# Patient Record
Sex: Female | Born: 1960 | Race: White | Hispanic: No | Marital: Married | Smoking: Former smoker
Health system: Southern US, Community
[De-identification: ages and names within clinical notes are randomized; demographics above are authoritative.]

## PROBLEM LIST (undated history)

## (undated) DIAGNOSIS — N879 Dysplasia of cervix uteri, unspecified: Secondary | ICD-10-CM

## (undated) DIAGNOSIS — F329 Major depressive disorder, single episode, unspecified: Secondary | ICD-10-CM

## (undated) DIAGNOSIS — F32A Depression, unspecified: Secondary | ICD-10-CM

## (undated) DIAGNOSIS — D649 Anemia, unspecified: Secondary | ICD-10-CM

## (undated) HISTORY — PX: HERNIA REPAIR: SHX51

## (undated) HISTORY — PX: APPENDECTOMY: SHX54

---

## 2004-04-30 ENCOUNTER — Ambulatory Visit: Payer: Self-pay | Admitting: Obstetrics and Gynecology

## 2005-06-15 ENCOUNTER — Ambulatory Visit: Payer: Self-pay | Admitting: Obstetrics and Gynecology

## 2006-06-19 ENCOUNTER — Ambulatory Visit: Payer: Self-pay | Admitting: Obstetrics and Gynecology

## 2007-07-03 ENCOUNTER — Ambulatory Visit: Payer: Self-pay | Admitting: Obstetrics and Gynecology

## 2008-10-08 ENCOUNTER — Ambulatory Visit: Payer: Self-pay | Admitting: Obstetrics and Gynecology

## 2009-10-26 ENCOUNTER — Ambulatory Visit: Payer: Self-pay | Admitting: Obstetrics and Gynecology

## 2009-11-09 ENCOUNTER — Ambulatory Visit: Payer: Self-pay | Admitting: Surgery

## 2009-12-03 ENCOUNTER — Ambulatory Visit: Payer: Self-pay | Admitting: Surgery

## 2010-11-08 ENCOUNTER — Ambulatory Visit: Payer: Self-pay | Admitting: Obstetrics and Gynecology

## 2011-11-22 ENCOUNTER — Ambulatory Visit: Payer: Self-pay | Admitting: Obstetrics and Gynecology

## 2012-11-22 ENCOUNTER — Ambulatory Visit: Payer: Self-pay | Admitting: Obstetrics and Gynecology

## 2013-11-26 ENCOUNTER — Ambulatory Visit: Payer: Self-pay | Admitting: Obstetrics and Gynecology

## 2014-12-16 ENCOUNTER — Other Ambulatory Visit: Payer: Self-pay | Admitting: Obstetrics and Gynecology

## 2014-12-16 DIAGNOSIS — Z1231 Encounter for screening mammogram for malignant neoplasm of breast: Secondary | ICD-10-CM

## 2014-12-23 ENCOUNTER — Ambulatory Visit
Admission: RE | Admit: 2014-12-23 | Discharge: 2014-12-23 | Disposition: A | Discharge status: Home / Self Care | Payer: BC Managed Care – PPO | Source: Ambulatory Visit | Attending: Obstetrics and Gynecology | Admitting: Obstetrics and Gynecology

## 2014-12-23 DIAGNOSIS — Z1231 Encounter for screening mammogram for malignant neoplasm of breast: Secondary | ICD-10-CM

## 2015-03-29 ENCOUNTER — Encounter: Payer: Self-pay | Admitting: *Deleted

## 2015-03-29 ENCOUNTER — Ambulatory Visit
Admission: EM | Admit: 2015-03-29 | Discharge: 2015-03-29 | Disposition: A | Discharge status: Home / Self Care | Payer: BC Managed Care – PPO | Attending: Internal Medicine | Admitting: Internal Medicine

## 2015-03-29 DIAGNOSIS — K047 Periapical abscess without sinus: Secondary | ICD-10-CM | POA: Diagnosis not present

## 2015-03-29 DIAGNOSIS — Z23 Encounter for immunization: Secondary | ICD-10-CM | POA: Diagnosis not present

## 2015-03-29 MED ORDER — TETANUS-DIPHTH-ACELL PERTUSSIS 5-2.5-18.5 LF-MCG/0.5 IM SUSP: 0.5000 mL | Freq: Once | INTRAMUSCULAR | Status: DC

## 2015-03-29 MED ORDER — INFLUENZA VIRUS VACC SPLIT PF IM SUSP: 0.5000 mL | Freq: Once | INTRAMUSCULAR | Status: DC

## 2015-03-29 MED ORDER — CLINDAMYCIN HCL 300 MG PO CAPS: 300.0000 mg | ORAL_CAPSULE | Freq: Three times a day (TID) | ORAL | Status: DC

## 2015-03-29 MED ORDER — INFLUENZA VAC SPLIT QUAD 0.5 ML IM SUSY
0.5000 mL | PREFILLED_SYRINGE | INTRAMUSCULAR | Status: AC
  Administered 2015-03-29: 0.5 mL via INTRAMUSCULAR

## 2015-03-29 MED ORDER — TETANUS-DIPHTHERIA TOXOIDS TD 5-2 LFU IM INJ
0.5000 mL | INJECTION | Freq: Once | INTRAMUSCULAR | Status: AC
  Administered 2015-03-29: 0.5 mL via INTRAMUSCULAR

## 2015-03-29 NOTE — Discharge Instructions (Signed)
Prescription for clindamycin (antibiotic) was sent to the [facility] in [place]. Followup with the dentist, to see if dental abscess has formed. Recheck if rash/blisters occur in swollen area of L cheek.

## 2015-03-29 NOTE — ED Provider Notes (Signed)
CSN: [id number]     Arrival date & time 03/29/15  1506 History   First MD Initiated Contact with Patient 03/29/15 1633     Chief Complaint  Patient presents with  . Facial Pain   HPI  53yo lady with remote hx root canal L upper incisor.  Now for 2-3d with pain, swelling L cheek/upper gums.  Some sensitivity of L upper medial incisor with biting. No fever. No rash.  No runny/congested nose.   Pt requests update of tetanus vaccine and a flu shot while she is here.  History reviewed. No pertinent past medical history. Past Surgical History  Procedure Laterality Date  . Appendectomy    . Hernia repair      Social History  Substance Use Topics  . Smoking status: Former Games developer  . Smokeless tobacco: None  . Alcohol Use: No    Review of Systems  All other systems reviewed and are negative.   Allergies  Amoxicillin and Augmentin  Home Medications   Prior to Admission medications   Medication Sig Start Date End Date Taking? Authorizing Provider  sertraline (ZOLOFT) 100 MG tablet Take 200 mg by mouth daily.   Yes Historical Provider, MD  clindamycin (CLEOCIN) 300 MG capsule Take 1 capsule (300 mg total) by mouth 3 (three) times daily. 03/29/15   Eustace Moore, MD   Meds Ordered and Administered this Visit   Medications  tetanus & diphtheria toxoids (adult) [facility]) injection 0.5 mL (0.5 mLs Intramuscular Given 03/29/15 1728)  Influenza vac split quadrivalent PF (FLUARIX) injection 0.5 mL (0.5 mLs Intramuscular Given 03/29/15 1710)    BP 97/51 mmHg  Pulse 65  Temp(Src) 98.1 F (36.7 C) (Oral)  Ht  (1.549 m)  Wt 108 lb (48.988 kg)  BMI 20.42 kg/m2  SpO2 99% No data found.   Physical Exam  Constitutional: She is oriented to person, place, and time. No distress.  Alert, nicely groomed  HENT:  Head: Atraumatic.  Minimal bogginess of gingiva surrounding upper incisors, no drainage observed.  L cheek slightly swollen/full, compared to R.  No redness, no rash observed.   No trismus.  Eyes:  Conjugate gaze, no eye redness/drainage  Neck: Neck supple.  Cardiovascular: Normal rate.   Pulmonary/Chest: No respiratory distress.  Abdominal: She exhibits no distension.  Musculoskeletal: Normal range of motion.  No leg swelling  Neurological: She is alert and oriented to person, place, and time.  Skin: Skin is warm and dry.  Pink. No cyanosis No rash on face.  Nursing note and vitals reviewed.   ED Course  Procedures (including critical care time)  Medications  tetanus & diphtheria toxoids (adult) (TENIVAC) injection 0.5 mL (0.5 mLs Intramuscular Given 03/29/15 1728)  Influenza vac split quadrivalent PF (FLUARIX) injection 0.5 mL (0.5 mLs Intramuscular Given 03/29/15 1710)     MDM   1. Dental infection    Discharge Medication List as of 03/29/2015  4:55 PM    START taking these medications   Details  clindamycin (CLEOCIN) 300 MG capsule Take 1 capsule (300 mg total) by mouth 3 (three) times daily., Starting 03/29/2015, Until Discontinued, Normal       Recheck if rash develops L cheek/nose.  Followup dentist to assess for dental abscess.    Eustace Moore, MD 03/29/15 [phone]

## 2015-03-29 NOTE — ED Notes (Signed)
Pt states she started having tooth pain Thursday, then woke up this morning with left sided facial pain and swelling

## 2016-03-14 ENCOUNTER — Other Ambulatory Visit: Payer: Self-pay | Admitting: Family Medicine

## 2016-03-14 DIAGNOSIS — Z1231 Encounter for screening mammogram for malignant neoplasm of breast: Secondary | ICD-10-CM

## 2016-04-04 ENCOUNTER — Ambulatory Visit
Admission: RE | Admit: 2016-04-04 | Discharge: 2016-04-04 | Disposition: A | Discharge status: Home / Self Care | Payer: BC Managed Care – PPO | Source: Ambulatory Visit | Attending: Family Medicine | Admitting: Family Medicine

## 2016-04-04 DIAGNOSIS — Z1231 Encounter for screening mammogram for malignant neoplasm of breast: Secondary | ICD-10-CM | POA: Insufficient documentation

## 2017-03-10 ENCOUNTER — Other Ambulatory Visit: Payer: Self-pay | Admitting: Family Medicine

## 2017-03-10 DIAGNOSIS — R102 Pelvic and perineal pain: Secondary | ICD-10-CM

## 2017-03-10 DIAGNOSIS — N939 Abnormal uterine and vaginal bleeding, unspecified: Secondary | ICD-10-CM

## 2017-03-14 ENCOUNTER — Ambulatory Visit
Admission: RE | Admit: 2017-03-14 | Discharge: 2017-03-14 | Disposition: A | Discharge status: Home / Self Care | Payer: BC Managed Care – PPO | Source: Ambulatory Visit | Attending: Family Medicine | Admitting: Family Medicine

## 2017-03-14 DIAGNOSIS — N939 Abnormal uterine and vaginal bleeding, unspecified: Secondary | ICD-10-CM | POA: Diagnosis not present

## 2017-03-14 DIAGNOSIS — R102 Pelvic and perineal pain: Secondary | ICD-10-CM | POA: Insufficient documentation

## 2017-03-16 ENCOUNTER — Other Ambulatory Visit: Payer: Self-pay | Admitting: Family Medicine

## 2017-03-16 DIAGNOSIS — Z1231 Encounter for screening mammogram for malignant neoplasm of breast: Secondary | ICD-10-CM

## 2017-04-07 ENCOUNTER — Ambulatory Visit
Admission: RE | Admit: 2017-04-07 | Discharge: 2017-04-07 | Disposition: A | Discharge status: Home / Self Care | Payer: BC Managed Care – PPO | Source: Ambulatory Visit | Attending: Family Medicine | Admitting: Family Medicine

## 2017-04-07 DIAGNOSIS — Z1231 Encounter for screening mammogram for malignant neoplasm of breast: Secondary | ICD-10-CM | POA: Diagnosis present

## 2017-04-21 ENCOUNTER — Encounter
Admission: RE | Admit: 2017-04-21 | Discharge: 2017-04-21 | Disposition: A | Discharge status: Home / Self Care | Payer: BC Managed Care – PPO | Source: Ambulatory Visit | Attending: Surgery | Admitting: Surgery

## 2017-04-21 HISTORY — DX: Dysplasia of cervix uteri, unspecified: N87.9

## 2017-04-21 HISTORY — DX: Anemia, unspecified: D64.9

## 2017-04-21 HISTORY — DX: Major depressive disorder, single episode, unspecified: F32.9

## 2017-04-21 HISTORY — DX: Depression, unspecified: F32.A

## 2017-04-21 NOTE — Patient Instructions (Signed)
  Your procedure is scheduled on: 04-28-17 Report to Same Day Surgery 2nd floor medical mall [facility](Medical Mall Entrance-take elevator on left to 2nd floor.  Check in with surgery information desk.) To find out your arrival time please call [phone] between 1PM - 3PM on 04-27-17  Remember: Instructions that are not followed completely may result in serious medical risk, up to and including death, or upon the discretion of your surgeon and anesthesiologist your surgery may need to be rescheduled.    _x___ 1. Do not eat food after midnight the night before your procedure. You may drink clear liquids up to 2 hours before you are scheduled to arrive at the hospital for your procedure.  Do not drink clear liquids within 2 hours of your scheduled arrival to the hospital.  Clear liquids include  --Water or Apple juice without pulp  --Clear carbohydrate beverage such as ClearFast or Gatorade  --Black Coffee or Clear Tea (No milk, no creamers, do not add anything to the coffee or Tea Type 1 and type 2 diabetics should only drink water.  No gum chewing or hard candies.     __x__ 2. No Alcohol for 24 hours before or after surgery.   __x__3. No Smoking for 24 prior to surgery.   ____  4. Bring all medications with you on the day of surgery if instructed.    __x__ 5. Notify your doctor if there is any change in your medical condition     (cold, fever, infections).     Do not wear jewelry, make-up, hairpins, clips or nail polish.  Do not wear lotions, powders, or perfumes. You may wear deodorant.  Do not shave 48 hours prior to surgery. Men may shave face and neck.  Do not bring valuables to the hospital.    [facility] is not responsible for any belongings or valuables.               Contacts, dentures or bridgework may not be worn into surgery.  Leave your suitcase in the car. After surgery it may be brought to your room.  For patients admitted to the hospital, discharge time is determined by  your treatment team.   Patients discharged the day of surgery will not be allowed to drive home.  You will need someone to drive you home and stay with you the night of your procedure.    Please read over the following fact sheets that you were given:     _x___ TAKE THE FOLLOWING MEDICATION THE MORNING OF SURGERY WITH A SMALL SIP OF WATER. These include:  1. SERTRALINE (ZOLOFT)  2.  3.  4.  5.  6.  ____Fleets enema or Magnesium Citrate as directed.   ____ Use CHG Soap or sage wipes as directed on instruction sheet   ____ Use inhalers on the day of surgery and bring to hospital day of surgery  ____ Stop Metformin and Janumet 2 days prior to surgery.    ____ Take 1/2 of usual insulin dose the night before surgery and none on the morning surgery.   ____ Follow recommendations from Cardiologist, Pulmonologist or PCP regarding stopping Aspirin, Coumadin, Plavix ,Eliquis, Effient, or Pradaxa, and Pletal.  X____Stop Anti-inflammatories such as Advil, Aleve, Ibuprofen, Motrin, Naproxen, Naprosyn, Goodies powders or aspirin products NOW-OK to take Tylenol    ____ Stop supplements until after surgery.   ____ Bring C-Pap to the hospital.

## 2017-04-28 ENCOUNTER — Encounter
Admission: RE | Disposition: A | Discharge status: Home / Self Care | Payer: Self-pay | Source: Ambulatory Visit | Attending: Surgery

## 2017-04-28 ENCOUNTER — Encounter: Payer: Self-pay | Admitting: Emergency Medicine

## 2017-04-28 ENCOUNTER — Ambulatory Visit: Payer: BC Managed Care – PPO | Admitting: Anesthesiology

## 2017-04-28 ENCOUNTER — Ambulatory Visit
Admission: RE | Admit: 2017-04-28 | Discharge: 2017-04-28 | Disposition: A | Discharge status: Home / Self Care | Payer: BC Managed Care – PPO | Source: Ambulatory Visit | Attending: Surgery | Admitting: Surgery

## 2017-04-28 DIAGNOSIS — Z8249 Family history of ischemic heart disease and other diseases of the circulatory system: Secondary | ICD-10-CM | POA: Diagnosis not present

## 2017-04-28 DIAGNOSIS — K429 Umbilical hernia without obstruction or gangrene: Secondary | ICD-10-CM | POA: Insufficient documentation

## 2017-04-28 DIAGNOSIS — Z87891 Personal history of nicotine dependence: Secondary | ICD-10-CM | POA: Diagnosis not present

## 2017-04-28 DIAGNOSIS — F329 Major depressive disorder, single episode, unspecified: Secondary | ICD-10-CM | POA: Diagnosis not present

## 2017-04-28 DIAGNOSIS — K439 Ventral hernia without obstruction or gangrene: Secondary | ICD-10-CM | POA: Insufficient documentation

## 2017-04-28 DIAGNOSIS — Z880 Allergy status to penicillin: Secondary | ICD-10-CM | POA: Diagnosis not present

## 2017-04-28 DIAGNOSIS — Z79899 Other long term (current) drug therapy: Secondary | ICD-10-CM | POA: Diagnosis not present

## 2017-04-28 HISTORY — PX: UMBILICAL HERNIA REPAIR: SHX196

## 2017-04-28 HISTORY — PX: VENTRAL HERNIA REPAIR: SHX424

## 2017-04-28 SURGERY — REPAIR, HERNIA, VENTRAL
Anesthesia: General | Site: Abdomen | Wound class: Clean

## 2017-04-28 MED ORDER — HYDROCODONE-ACETAMINOPHEN 5-325 MG PO TABS
1.0000 | ORAL_TABLET | Freq: Four times a day (QID) | ORAL | 0 refills | Status: AC | PRN
Start: 2017-04-28 — End: ?

## 2017-04-28 MED ORDER — LACTATED RINGERS IV SOLN
INTRAVENOUS | Status: DC
  Administered 2017-04-28 (×2): via INTRAVENOUS

## 2017-04-28 MED ORDER — MIDAZOLAM HCL 2 MG/2ML IJ SOLN
INTRAMUSCULAR | Status: DC | PRN
  Administered 2017-04-28: 1 mg via INTRAVENOUS

## 2017-04-28 MED ORDER — EPHEDRINE SULFATE 50 MG/ML IJ SOLN
INTRAMUSCULAR | Status: DC | PRN
  Administered 2017-04-28 (×2): 10 mg via INTRAVENOUS

## 2017-04-28 MED ORDER — BUPIVACAINE-EPINEPHRINE 0.5% -1:200000 IJ SOLN
INTRAMUSCULAR | Status: DC | PRN
  Administered 2017-04-28: 10 mL

## 2017-04-28 MED ORDER — DEXAMETHASONE SODIUM PHOSPHATE 10 MG/ML IJ SOLN
INTRAMUSCULAR | Status: AC
  Filled 2017-04-28: qty 1

## 2017-04-28 MED ORDER — ONDANSETRON HCL 4 MG/2ML IJ SOLN
INTRAMUSCULAR | Status: DC | PRN
  Administered 2017-04-28: 4 mg via INTRAVENOUS

## 2017-04-28 MED ORDER — LIDOCAINE HCL (PF) 2 % IJ SOLN
INTRAMUSCULAR | Status: AC
  Filled 2017-04-28: qty 10

## 2017-04-28 MED ORDER — MIDAZOLAM HCL 2 MG/2ML IJ SOLN
INTRAMUSCULAR | Status: AC
  Filled 2017-04-28: qty 2

## 2017-04-28 MED ORDER — GLYCOPYRROLATE 0.2 MG/ML IJ SOLN
INTRAMUSCULAR | Status: DC | PRN
  Administered 2017-04-28: 0.2 mg via INTRAVENOUS

## 2017-04-28 MED ORDER — FENTANYL CITRATE (PF) 100 MCG/2ML IJ SOLN
INTRAMUSCULAR | Status: AC
Start: 2017-04-28 — End: ?
  Filled 2017-04-28: qty 2

## 2017-04-28 MED ORDER — DEXAMETHASONE SODIUM PHOSPHATE 10 MG/ML IJ SOLN
INTRAMUSCULAR | Status: DC | PRN
  Administered 2017-04-28: 10 mg via INTRAVENOUS

## 2017-04-28 MED ORDER — SEVOFLURANE IN SOLN
RESPIRATORY_TRACT | Status: AC
  Filled 2017-04-28: qty 250

## 2017-04-28 MED ORDER — PROPOFOL 10 MG/ML IV BOLUS
INTRAVENOUS | Status: DC | PRN
  Administered 2017-04-28: 130 mg via INTRAVENOUS
  Administered 2017-04-28 (×2): 20 mg via INTRAVENOUS

## 2017-04-28 MED ORDER — PROPOFOL 10 MG/ML IV BOLUS
INTRAVENOUS | Status: AC
  Filled 2017-04-28: qty 20

## 2017-04-28 MED ORDER — FAMOTIDINE 20 MG PO TABS
20.0000 mg | ORAL_TABLET | Freq: Once | ORAL | Status: AC
  Administered 2017-04-28: 20 mg via ORAL

## 2017-04-28 MED ORDER — FAMOTIDINE 20 MG PO TABS
ORAL_TABLET | ORAL | Status: AC
  Filled 2017-04-28: qty 1

## 2017-04-28 MED ORDER — FENTANYL CITRATE (PF) 100 MCG/2ML IJ SOLN
INTRAMUSCULAR | Status: DC | PRN
  Administered 2017-04-28 (×2): 50 ug via INTRAVENOUS

## 2017-04-28 MED ORDER — FENTANYL CITRATE (PF) 100 MCG/2ML IJ SOLN: 25.0000 ug | INTRAMUSCULAR | Status: DC | PRN

## 2017-04-28 MED ORDER — BUPIVACAINE-EPINEPHRINE (PF) 0.5% -1:200000 IJ SOLN
INTRAMUSCULAR | Status: AC
  Filled 2017-04-28: qty 30

## 2017-04-28 MED ORDER — LIDOCAINE HCL (CARDIAC) 20 MG/ML IV SOLN
INTRAVENOUS | Status: DC | PRN
  Administered 2017-04-28: 40 mg via INTRAVENOUS

## 2017-04-28 MED ORDER — HYDROCODONE-ACETAMINOPHEN 5-325 MG PO TABS: 1.0000 | ORAL_TABLET | ORAL | Status: DC | PRN

## 2017-04-28 MED ORDER — ONDANSETRON HCL 4 MG/2ML IJ SOLN: 4.0000 mg | Freq: Once | INTRAMUSCULAR | Status: DC | PRN

## 2017-04-28 SURGICAL SUPPLY — 30 items
BLADE SURG 15 STRL LF DISP TIS (BLADE) ×1 IMPLANT
BLADE SURG 15 STRL SS (BLADE) ×2
CANISTER SUCT 1200ML W/VALVE (MISCELLANEOUS) ×3 IMPLANT
CHLORAPREP W/TINT 26ML (MISCELLANEOUS) ×3 IMPLANT
DERMABOND ADVANCED (GAUZE/BANDAGES/DRESSINGS) ×2
DERMABOND ADVANCED .7 DNX12 (GAUZE/BANDAGES/DRESSINGS) ×1 IMPLANT
DRAPE LAPAROTOMY 100X77 ABD (DRAPES) ×3 IMPLANT
DRAPE LAPAROTOMY 77X122 PED (DRAPES) IMPLANT
ELECT REM PT RETURN 9FT ADLT (ELECTROSURGICAL) ×3
ELECTRODE REM PT RTRN 9FT ADLT (ELECTROSURGICAL) ×1 IMPLANT
GAUZE SPONGE 4X4 12PLY STRL (GAUZE/BANDAGES/DRESSINGS) IMPLANT
GLOVE BIO SURGEON STRL SZ7.5 (GLOVE) ×3 IMPLANT
GOWN STRL REUS W/ TWL LRG LVL3 (GOWN DISPOSABLE) ×3 IMPLANT
GOWN STRL REUS W/TWL LRG LVL3 (GOWN DISPOSABLE) ×6
KIT RM TURNOVER STRD PROC AR (KITS) ×3 IMPLANT
LABEL OR SOLS (LABEL) IMPLANT
NEEDLE HYPO 25X1 1.5 SAFETY (NEEDLE) ×3 IMPLANT
NS IRRIG 500ML POUR BTL (IV SOLUTION) ×3 IMPLANT
PACK BASIN MINOR ARMC (MISCELLANEOUS) ×3 IMPLANT
STAPLER SKIN PROX 35W (STAPLE) IMPLANT
SUT CHROMIC 3 0 SH 27 (SUTURE) ×3 IMPLANT
SUT CHROMIC 4 0 RB 1X27 (SUTURE) ×3 IMPLANT
SUT MNCRL 4-0 (SUTURE) ×4
SUT MNCRL 4-0 27XMFL (SUTURE) ×2
SUT MNCRL+ 5-0 UNDYED PC-3 (SUTURE) ×1 IMPLANT
SUT MONOCRYL 5-0 (SUTURE) ×2
SUT SURGILON 0 30 BLK (SUTURE) ×9 IMPLANT
SUTURE MNCRL 4-0 27XMF (SUTURE) ×2 IMPLANT
SYR BULB IRRIG 60ML STRL (SYRINGE) ×3 IMPLANT
SYRINGE 10CC LL (SYRINGE) ×3 IMPLANT

## 2017-04-28 NOTE — Anesthesia Post-op Follow-up Note (Signed)
Anesthesia QCDR form completed.        

## 2017-04-28 NOTE — H&P (Signed)
  She came in today for umbilical hernia and ventral hernia repair. She reported no change in overall condition since the office exam. She was seen before surgery and examined and demonstrated the location of the ventral hernia and demonstrated the umbilical hernia. The recent lab work was reviewed  I discussed the plan for surgery.

## 2017-04-28 NOTE — Discharge Instructions (Addendum)
Take Tylenol or Norco if needed for pain. ° °Should not drive or do anything dangerous when taking Norco. ° °Avoid straining and heavy lifting. ° °May shower and blot dry. ° ° °AMBULATORY SURGERY  °DISCHARGE INSTRUCTIONS ° ° °1) The drugs that you were given will stay in your system until tomorrow so for the next 24 hours you should not: ° °A) Drive an automobile °B) Make any legal decisions °C) Drink any alcoholic beverage ° ° °2) You may resume regular meals tomorrow.  Today it is better to start with liquids and gradually work up to solid foods. ° °You may eat anything you prefer, but it is better to start with liquids, then soup and crackers, and gradually work up to solid foods. ° ° °3) Please notify your doctor immediately if you have any unusual bleeding, trouble breathing, redness and pain at the surgery site, drainage, fever, or pain not relieved by medication. ° ° ° °4) Additional Instructions: ° ° ° ° ° ° ° °Please contact your physician with any problems or Same Day Surgery at [phone], Monday through Friday 6 am to 4 pm, or [place] [facility] [place] Main number at [phone]. °

## 2017-04-28 NOTE — Anesthesia Procedure Notes (Signed)
Procedure Name: LMA Insertion Date/Time: 04/28/2017 2:00 PM Performed by: Trayon Krantz Pre-anesthesia Checklist: Patient identified, Emergency Drugs available, Suction available, Patient being monitored and Timeout performed Patient Re-evaluated:Patient Re-evaluated prior to induction Oxygen Delivery Method: Circle system utilized Preoxygenation: Pre-oxygenation with 100% oxygen Induction Type: IV induction Ventilation: Mask ventilation without difficulty LMA: LMA inserted LMA Size: 4.0 Number of attempts: 1 Placement Confirmation: positive ETCO2 and breath sounds checked- equal and bilateral Tube secured with: Tape Dental Injury: Teeth and Oropharynx as per pre-operative assessment

## 2017-04-28 NOTE — Transfer of Care (Signed)
Immediate Anesthesia Transfer of Care Note  Patient: Jean RosenthalGloria S Laramie  Procedure(s) Performed: HERNIA REPAIR VENTRAL ADULT (N/A Abdomen) HERNIA REPAIR UMBILICAL ADULT (N/A Abdomen)  Patient Location: PACU  Anesthesia Type:General  Level of Consciousness: sedated  Airway & Oxygen Therapy: Patient Spontanous Breathing and Patient connected to face mask oxygen  Post-op Assessment: Report given to RN and Post -op Vital signs reviewed and stable  Post vital signs: Reviewed and stable  Last Vitals:  Vitals:   04/28/17 1304 04/28/17 1529  BP: (!) 108/58 (!) 116/105  Pulse: (!) 56 88  Resp: 18 18  Temp: (!) 35.8 C 37.1 C  SpO2: 100% 100%    Last Pain:  Vitals:   04/28/17 1304  TempSrc: Tympanic         Complications: No apparent anesthesia complications

## 2017-04-28 NOTE — Anesthesia Preprocedure Evaluation (Signed)
Anesthesia Evaluation  Patient identified by MRN, date of birth, ID band Patient awake    Reviewed: Allergy & Precautions, H&P , NPO status , Patient's Chart, lab work & pertinent test results, reviewed documented beta blocker date and time   Airway Mallampati: II  TM Distance: >3 FB Neck ROM: full    Dental  (+) Teeth Intact   Pulmonary neg pulmonary ROS, former smoker,    Pulmonary exam normal        Cardiovascular Exercise Tolerance: Good negative cardio ROS Normal cardiovascular exam Rate:Normal     Neuro/Psych PSYCHIATRIC DISORDERS negative neurological ROS  negative psych ROS   GI/Hepatic negative GI ROS, Neg liver ROS,   Endo/Other  negative endocrine ROS  Renal/GU negative Renal ROS  negative genitourinary   Musculoskeletal   Abdominal   Peds  Hematology negative hematology ROS (+) anemia ,   Anesthesia Other Findings   Reproductive/Obstetrics negative OB ROS                             Anesthesia Physical Anesthesia Plan  ASA: II  Anesthesia Plan: General LMA   Post-op Pain Management:    Induction:   PONV Risk Score and Plan: 4 or greater and Ondansetron, Dexamethasone, Midazolam and Propofol infusion  Airway Management Planned:   Additional Equipment:   Intra-op Plan:   Post-operative Plan:   Informed Consent: I have reviewed the patients History and Physical, chart, labs and discussed the procedure including the risks, benefits and alternatives for the proposed anesthesia with the patient or authorized representative who has indicated his/her understanding and acceptance.     Plan Discussed with: CRNA  Anesthesia Plan Comments:         Anesthesia Quick Evaluation

## 2017-04-28 NOTE — Progress Notes (Signed)
Dr. Smith into see 

## 2017-04-28 NOTE — Anesthesia Postprocedure Evaluation (Signed)
Anesthesia Post Note  Patient: Carla Miranda  Procedure(s) Performed: HERNIA REPAIR VENTRAL ADULT (N/A Abdomen) HERNIA REPAIR UMBILICAL ADULT (N/A Abdomen)  Patient location during evaluation: PACU Anesthesia Type: General Level of consciousness: awake and alert Pain management: pain level controlled Vital Signs Assessment: post-procedure vital signs reviewed and stable Respiratory status: spontaneous breathing, nonlabored ventilation, respiratory function stable and patient connected to nasal cannula oxygen Cardiovascular status: blood pressure returned to baseline and stable Postop Assessment: no apparent nausea or vomiting Anesthetic complications: no     Last Vitals:  Vitals:   04/28/17 1621 04/28/17 1655  BP: 120/74 113/71  Pulse: 71 69  Resp: 14   Temp: 37 C   SpO2: 100% 100%    Last Pain:  Vitals:   04/28/17 1655  TempSrc:   PainSc: 2                  Lenard SimmerAndrew Jourdan Durbin

## 2017-04-28 NOTE — Op Note (Signed)
OPERATIVE REPORT  PREOPERATIVE  DIAGNOSIS: Umbilical hernia, ventral hernia  POSTOPERATIVE DIAGNOSIS: Umbilical hernia, ventral hernia  PROCEDURE: Umbilical hernia repair, ventral hernia repair  ANESTHESIA: General  SURGEON: Renda RollsWilton Smith M.D.  INDICATIONS:  She has had recent development of a small area of bulging at he navel and also another site in the left upper quadrant left of midline approximately 3 cm cephalad to the navel.  The patient was placed on the operating table in the supine position under general anesthesia. The abdomen was prepared with ChloraPrep and draped in a sterile manner.. The ventral hernia was repaired first. A transversely oriented incision was made in the left upper quadrant left of midline about 3 cm cephalad to the umbilicus and carried down through subcutaneous tissues. Several small bleeding points were cauterized. There was a ventral hernia sac which was dissected free from surrounding structures into the fascial ring defect. The sac was separated from the fascial ring defect and reduced back into the abdominal cavity. The repair was carried out with interrupted 0 Surgilon figure-of-eight sutures. Subcutaneous tissues were infiltrated with half percent Sensorcaine with epinephrine The wound was closed with a running 5-0 Monocryl subcuticular suture.   A transversely oriented infraumbilical curvilinear incision was made and carried down through subcutaneous tissues. Hernia sac was dissected free from surrounding structures and dissected away from the skin of the umbilicus. This was dissected down through a fascial ring defect and inverted back into the abdominal cavity.The fascial ring defect was closed with interrupted 0 Surgilon figure-of-eight sutures. . The subcutaneous tissues were infiltrated with half percent Sensorcaine with epinephrine. The skin of the umbilicus was sutured to the fascia with 3-0 chromic suture.The skin was closed with running 5-0 Monocryl  subcuticular suture.  Both wounds were then treated with Dermabond. The patient appeared to be in satisfactory condition and was prepared for transfer to the recovery room  Renda RollsWilton Smith M.D.

## 2017-05-01 ENCOUNTER — Encounter: Payer: Self-pay | Admitting: Surgery

## 2019-08-20 IMAGING — US US PELVIS COMPLETE TRANSABD/TRANSVAG
1 series · 14 of 25 positions shown · non-contrast
Comparison: No recent.

CLINICAL DATA: Pelvic pain.  Vaginal bleeding.

EXAM:
TRANSABDOMINAL AND TRANSVAGINAL ULTRASOUND OF PELVIS
TECHNIQUE: Both transabdominal and transvaginal ultrasound examinations of the
pelvis were performed. Transabdominal technique was performed for
global imaging of the pelvis including uterus, ovaries, adnexal
regions, and pelvic cul-de-sac. It was necessary to proceed with
endovaginal exam following the transabdominal exam to visualize the
uterus and ovaries.

[Series 1: us pelvis complete transabd/transvag · 0.22mm/px · 14 of 101 slices shown]
[im 1/101]
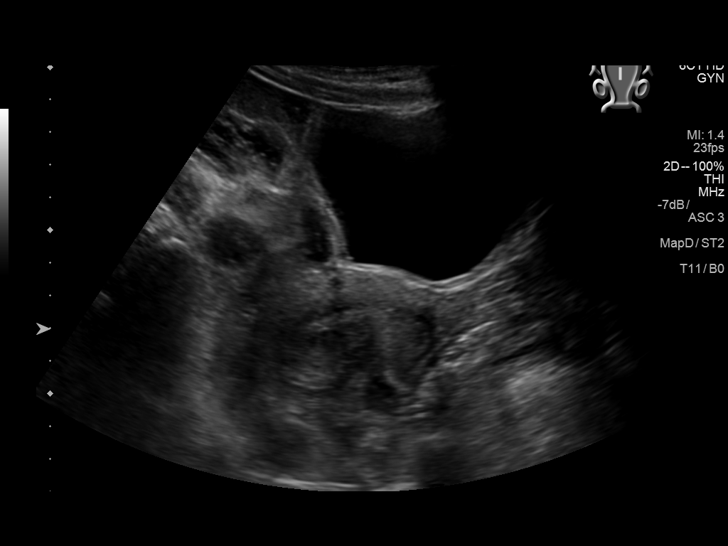
[im 9/101]
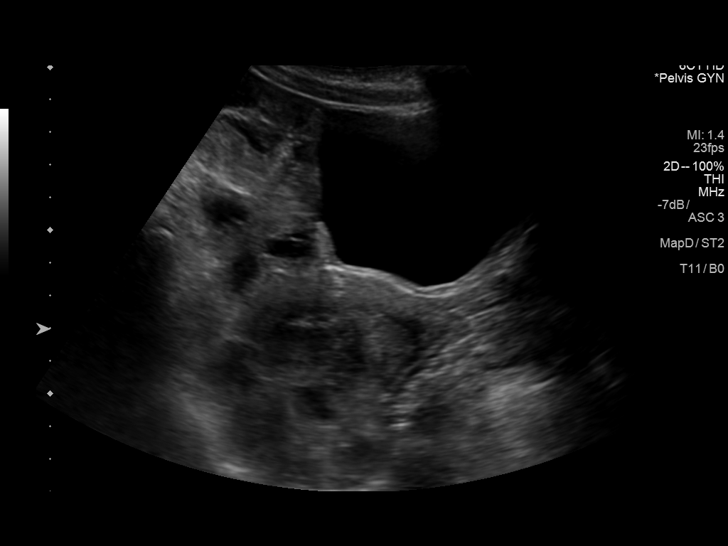
[im 17/101]
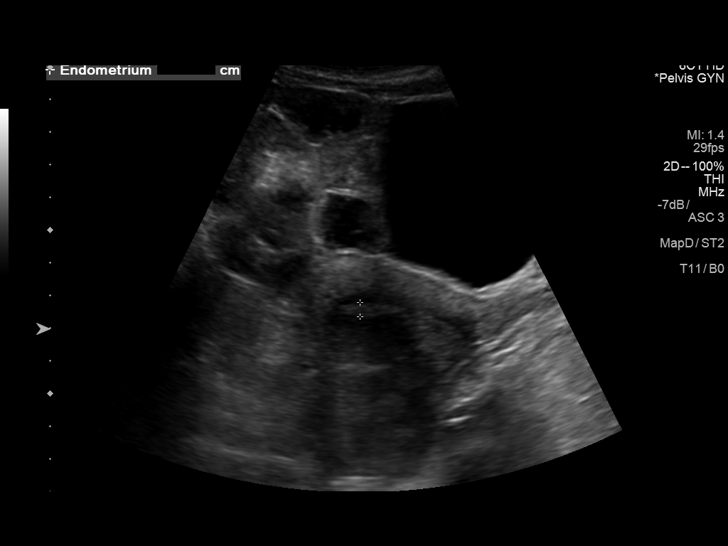
[im 26/101]
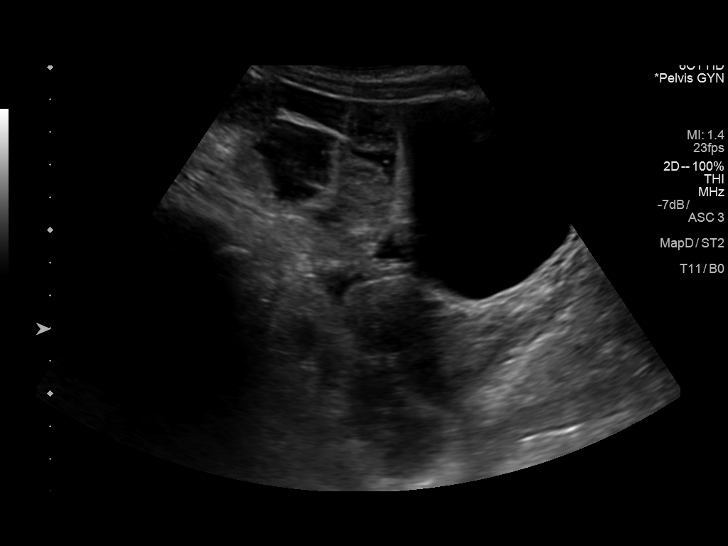
[im 34/101]
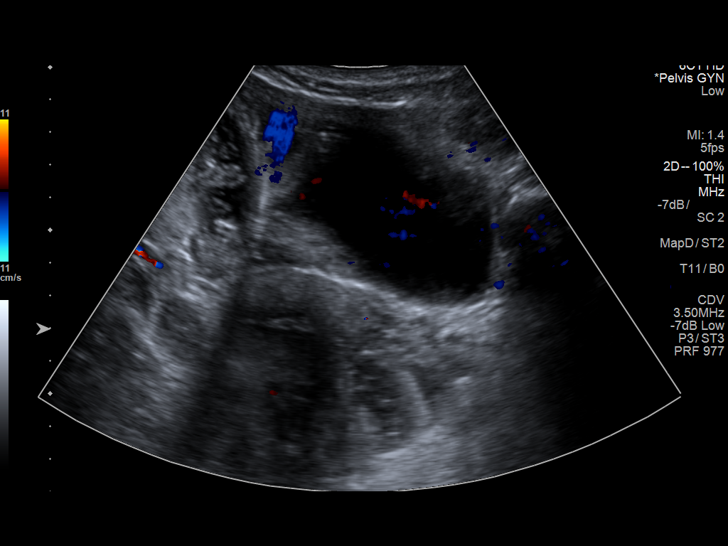
[im 38/101]
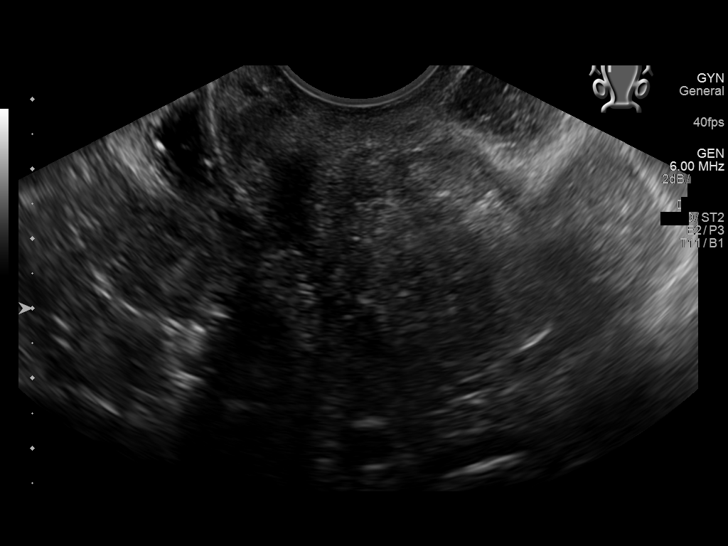
[im 46/101]
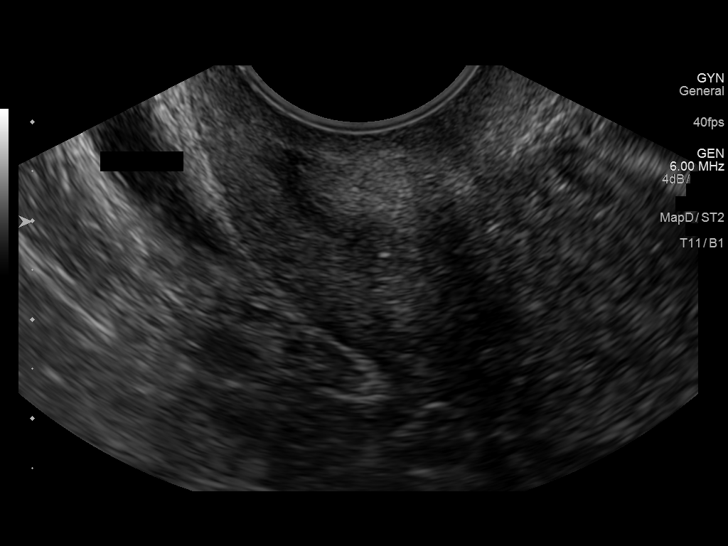
[im 55/101]
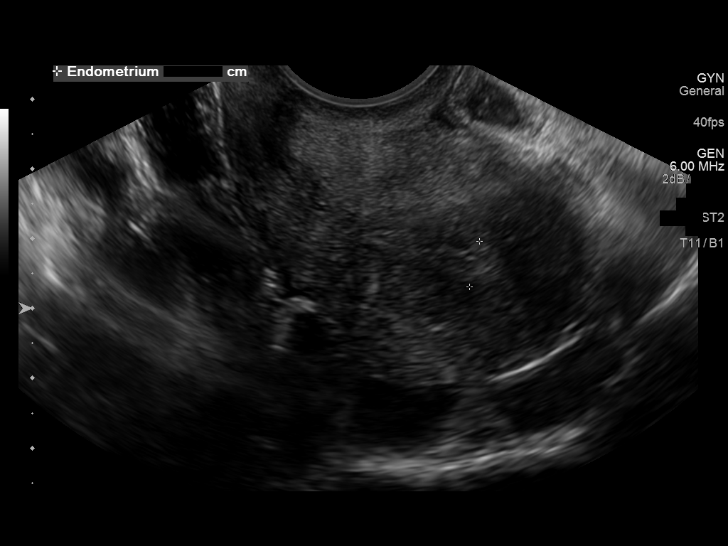
[im 63/101]
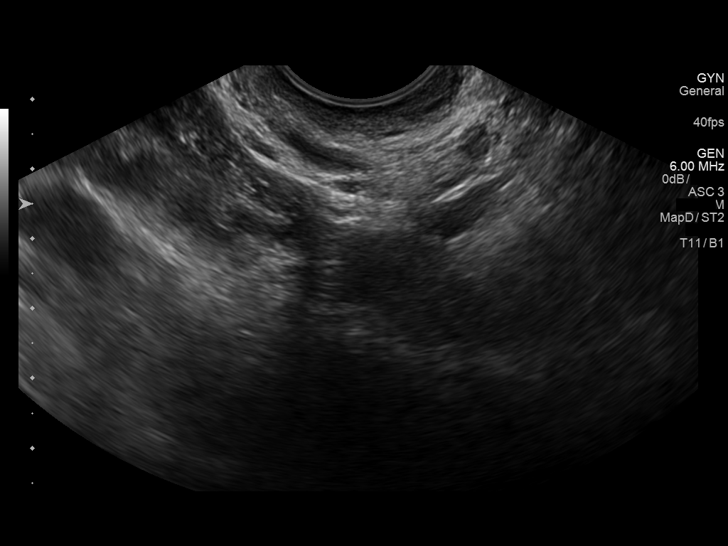
[im 67/101]
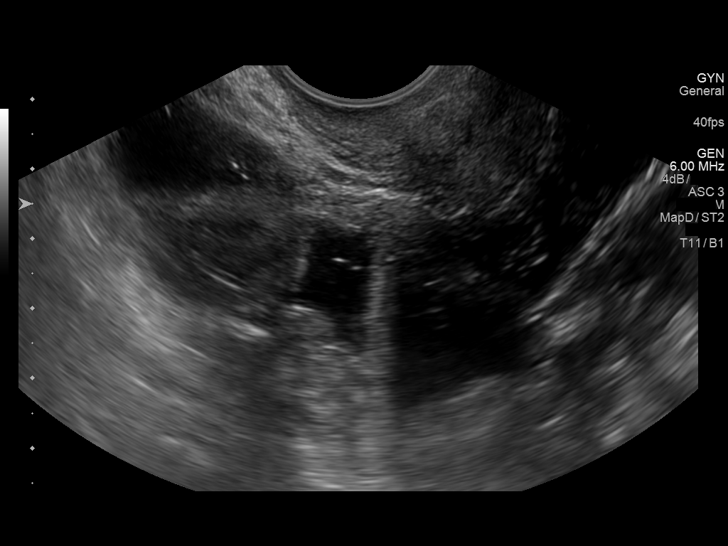
[im 76/101]
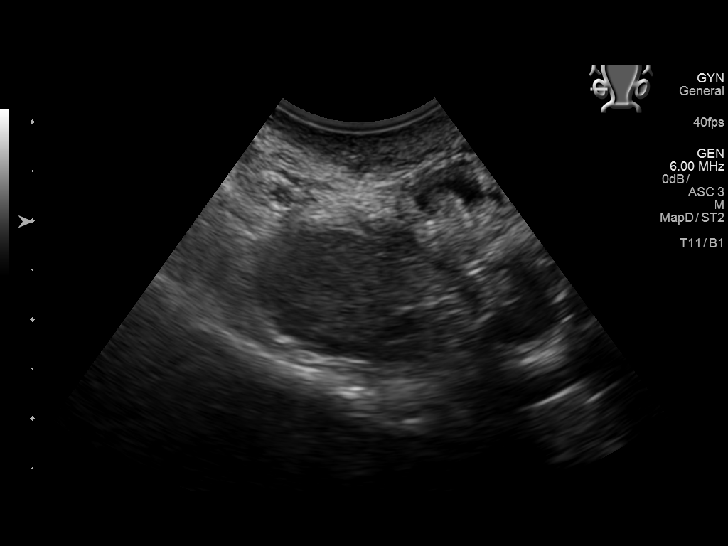
[im 84/101]
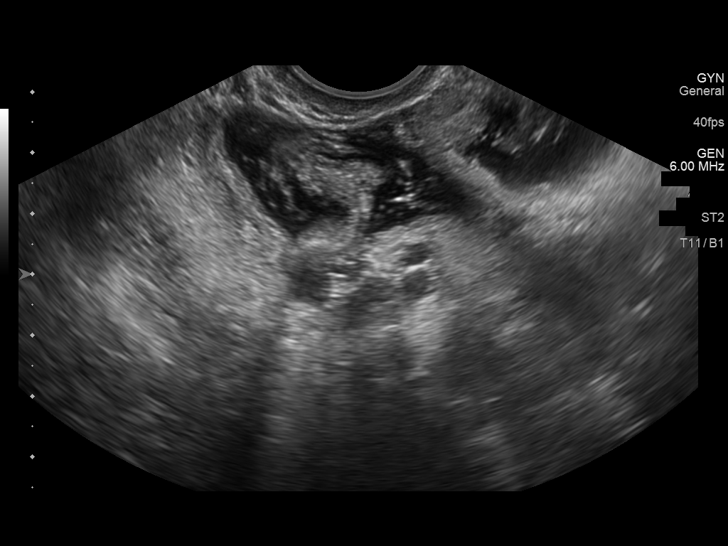
[im 92/101]
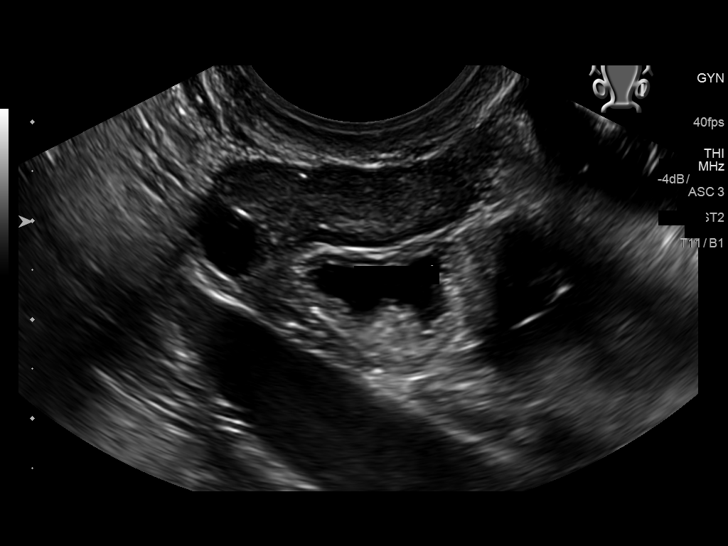
[im 101/101]
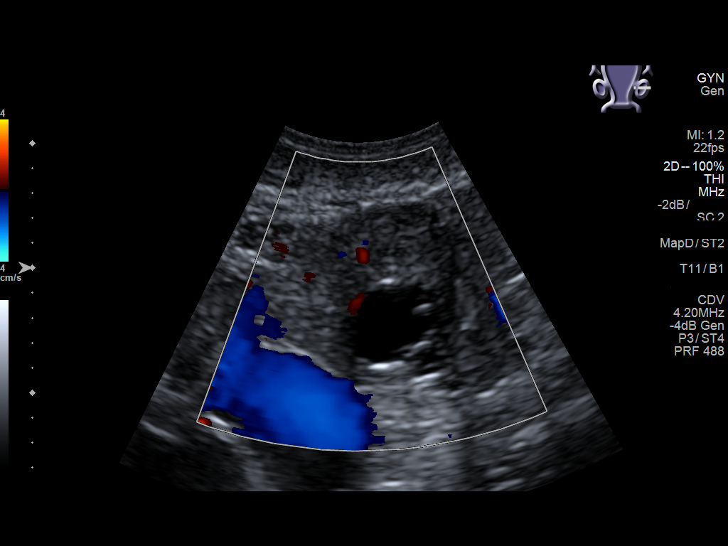

[14 of 25 positions shown; findings below may reference images not displayed]

FINDINGS: Uterus

Measurements: 6.1 x 3.5 x 4.0 cm. No fibroids or other mass
visualized.

Endometrium

Thickness: 6.7 mm.  No focal abnormality visualized.

Right ovary

Measurements: 2.2 x 1.1 x 1.6 cm. Normal appearance/no adnexal mass.

Left ovary

Measurements: 2.9 x 1.0 x 0.9 cm. Normal appearance/no adnexal mass.

Other findings

No abnormal free fluid.
IMPRESSION: No acute or focal abnormality.
